# Patient Record
Sex: Male | Born: 1950 | Race: White | Hispanic: No | Smoking: Never smoker
Health system: Southern US, Community
[De-identification: ages and names within clinical notes are randomized; demographics above are authoritative.]

## PROBLEM LIST (undated history)

## (undated) DIAGNOSIS — E785 Hyperlipidemia, unspecified: Secondary | ICD-10-CM

## (undated) HISTORY — PX: APPENDECTOMY: SHX54

## (undated) HISTORY — PX: OTHER SURGICAL HISTORY: SHX169

## (undated) HISTORY — DX: Hyperlipidemia, unspecified: E78.5

---

## 2004-09-10 ENCOUNTER — Ambulatory Visit (HOSPITAL_COMMUNITY): Admission: RE | Admit: 2004-09-10 | Discharge: 2004-09-10 | Payer: Self-pay | Admitting: General Surgery

## 2007-08-11 ENCOUNTER — Ambulatory Visit (HOSPITAL_COMMUNITY): Admission: RE | Admit: 2007-08-11 | Discharge: 2007-08-11 | Payer: Self-pay | Admitting: Family Medicine

## 2009-08-24 ENCOUNTER — Emergency Department (HOSPITAL_COMMUNITY): Admission: EM | Admit: 2009-08-24 | Discharge: 2009-08-24 | Payer: Self-pay | Admitting: Emergency Medicine

## 2011-01-11 NOTE — H&P (Signed)
Patrick Burgess, Patrick Burgess               ACCOUNT NO.:  [id number]   MEDICAL RECORD NO.:  [id number]           PATIENT TYPE:   LOCATION:                                 FACILITY:   PHYSICIAN:  Dalia Heading, M.D.  DATE OF BIRTH:  June 02, 1951   DATE OF ADMISSION:  DATE OF DISCHARGE:  LH                                HISTORY & PHYSICAL   CHIEF COMPLAINT:  Need for screening colonoscopy.   HISTORY OF PRESENT ILLNESS:  The patient is a 60 year old white male who is  referred for endoscopic evaluation.  He needs colonoscopy for screening  purposes.  No abdominal pain, weight loss, nausea, vomiting, diarrhea,  constipation, melena, hematochezia have been noted.  He has never had a  colonoscopy.  There is no family history of colon carcinoma.   PAST MEDICAL HISTORY:  Unremarkable.   PAST SURGICAL HISTORY:  Appendectomy.   CURRENT MEDICATIONS:  None.   ALLERGIES:  No known drug allergies.   REVIEW OF SYSTEMS:  Noncontributory.   PHYSICAL EXAMINATION:  GENERAL APPEARANCE:  The patient is a well-developed,  well-nourished white male in no acute distress.  VITAL SIGNS:  He is afebrile and vital signs are stable.  LUNGS:  Clear to auscultation with good breath sounds bilaterally.  HEART:  Regular rate and rhythm without S3, S4, murmurs.  ABDOMEN:  Soft with an easily reducible left inguinal hernia.  No right  inguinal hernia is noted.  No hepatosplenomegaly or masses are noted.  RECTAL:  Deferred at the procedure.   IMPRESSION:  Need for screening colonoscopy, asymptomatic left inguinal  hernia.   PLAN:  The patient is scheduled for a colonoscopy on September 10, 2004.  The  risks and benefits of the procedure including bleeding and perforation were  fully explained to the patient gaining informed consent.     Mark   MAJ/MEDQ  D:  09/04/2004  T:  09/04/2004  Job:  [id number]   cc:   Mila Homer. Sudie Bailey, M.D.  [address] [place], [place] [address]  Fax: [phone]
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
## 2011-10-24 NOTE — H&P (Signed)
  NTS SOAP Note  Vital Signs:  Vitals as of: 10/24/2011: Systolic 134: Diastolic 75: Heart Rate 61: Temp 97F: Height 88ft 9.5in: Weight 160Lbs 0 Ounces: OFC 0in: Respiratory Rate 0: O2 Saturation 0: Pain Level 0: BMI 23  BMI : 23.29 kg/m2  Subjective: This 61 Years 2 Months old Male presents forcolonoscopy.  last had a TCS in 2006, polyp removed.  Denies any GI complaints.  No family h/o colon cancer.  Review of Symptoms:  Constitutional:unremarkable Head:unremarkable Eyes:unremarkable Nose/Mouth/Throat:unremarkable Cardiovascular:unremarkable Respiratory:unremarkable Gastrointestinal:unremarkable Genitourinary:unremarkable Musculoskeletal:unremarkable Skin:unremarkable Hematolgic/Lymphatic:unremarkable Allergic/Immunologic:unremarkable   Past Medical History:Reviewed   Past Medical History  Surgical History: appendectomy, TCS in 2006 Medical Problems: none Allergies: nkda Medications: none   Social History:Reviewed   Social History  Preferred Language: English ([place]) Ethnicity: Not Hispanic / Latino Age: 61 Years 2 Months Marital Status:  M Alcohol:  No Recreational drug(s):  No   Smoking Status: Never smoker reviewed on 10/24/2011  Family History:Reviewed   Family History  Is there a family history of:No family h/o colon cancer             Father:  Coronary Artery Disease    Objective Information: General:Well appearing, well nourished in no distress. Head:Atraumatic; no masses; no abnormalities Heart:RRR, no murmur or gallop.  Normal S1, S2.  No S3, S4.  Lungs:CTA bilaterally, no wheezes, rhonchi, rales.  Breathing unlabored. Abdomen:Soft, NT/ND, no HSM, no masses. deferred to procedure  Assessment:screening TCS  Diagnosis &amp; Procedure: DiagnosisCode: V76.51, ProcedureCode: 40981,    Plan:Scheduled for TCS on 10/29/11.   Patient Education:Alternative treatments  to surgery were discussed with patient (and family).Risks and benefits  of procedure were fully explained to the patient (and family) who gave informed consent. Patient/family questions were addressed.  Follow-up:Pending Surgery

## 2011-10-28 ENCOUNTER — Encounter (HOSPITAL_COMMUNITY): Payer: Self-pay | Admitting: Pharmacy Technician

## 2011-10-28 MED ORDER — SODIUM CHLORIDE 0.45 % IV SOLN
Freq: Once | INTRAVENOUS | Status: AC
  Administered 2011-10-29: 08:00:00 via INTRAVENOUS

## 2011-10-29 ENCOUNTER — Encounter (HOSPITAL_COMMUNITY)
Admission: RE | Disposition: A | Discharge status: Home / Self Care | Payer: Self-pay | Source: Ambulatory Visit | Attending: General Surgery

## 2011-10-29 ENCOUNTER — Encounter (HOSPITAL_COMMUNITY): Payer: Self-pay | Admitting: *Deleted

## 2011-10-29 ENCOUNTER — Ambulatory Visit (HOSPITAL_COMMUNITY)
Admission: RE | Admit: 2011-10-29 | Discharge: 2011-10-29 | Disposition: A | Discharge status: Home / Self Care | Payer: BC Managed Care – PPO | Source: Ambulatory Visit | Attending: General Surgery | Admitting: General Surgery

## 2011-10-29 DIAGNOSIS — Z860100 Personal history of colon polyps, unspecified: Secondary | ICD-10-CM | POA: Insufficient documentation

## 2011-10-29 HISTORY — PX: COLONOSCOPY: SHX5424

## 2011-10-29 SURGERY — COLONOSCOPY
Anesthesia: Moderate Sedation

## 2011-10-29 MED ORDER — MEPERIDINE HCL 50 MG/ML IJ SOLN
INTRAMUSCULAR | Status: AC
  Filled 2011-10-29: qty 1

## 2011-10-29 MED ORDER — MIDAZOLAM HCL 5 MG/5ML IJ SOLN
INTRAMUSCULAR | Status: AC
  Filled 2011-10-29: qty 5

## 2011-10-29 MED ORDER — STERILE WATER FOR IRRIGATION IR SOLN
Status: DC | PRN
  Administered 2011-10-29: 09:00:00

## 2011-10-29 MED ORDER — MIDAZOLAM HCL 5 MG/5ML IJ SOLN
INTRAMUSCULAR | Status: DC | PRN
  Administered 2011-10-29: 1 mg via INTRAVENOUS
  Administered 2011-10-29: 3 mg via INTRAVENOUS

## 2011-10-29 MED ORDER — MEPERIDINE HCL 25 MG/ML IJ SOLN
INTRAMUSCULAR | Status: DC | PRN
  Administered 2011-10-29: 50 mg via INTRAVENOUS
  Administered 2011-10-29: 25 mg via INTRAVENOUS

## 2011-10-29 NOTE — Discharge Instructions (Signed)
Colonoscopy Care After Read the instructions outlined below and refer to this sheet in the next few weeks. These discharge instructions provide you with general information on caring for yourself after you leave the hospital. Your doctor may also give you specific instructions. While your treatment has been planned according to the most current medical practices available, unavoidable complications occasionally occur. If you have any problems or questions after discharge, call your doctor. HOME CARE INSTRUCTIONS ACTIVITY:  You may resume your regular activity, but move at a slower pace for the next 24 hours.   Take frequent rest periods for the next 24 hours.   Walking will help get rid of the air and reduce the bloated feeling in your belly (abdomen).   No driving for 24 hours (because of the medicine (anesthesia) used during the test).   You may shower.   Do not sign any important legal documents or operate any machinery for 24 hours (because of the anesthesia used during the test).  NUTRITION:  Drink plenty of fluids.   You may resume your normal diet as instructed by your doctor.   Begin with a light meal and progress to your normal diet. Heavy or fried foods are harder to digest and may make you feel sick to your stomach (nauseated).   Avoid alcoholic beverages for 24 hours or as instructed.  MEDICATIONS:  You may resume your normal medications unless your doctor tells you otherwise.  WHAT TO EXPECT TODAY:  Some feelings of bloating in the abdomen.   Passage of more gas than usual.   Spotting of blood in your stool or on the toilet paper.  IF YOU HAD POLYPS REMOVED DURING THE COLONOSCOPY:  No aspirin products for 7 days or as instructed.   No alcohol for 7 days or as instructed.   Eat a soft diet for the next 24 hours.  FINDING OUT THE RESULTS OF YOUR TEST Not all test results are available during your visit. If your test results are not back during the visit, make an  appointment with your caregiver to find out the results. Do not assume everything is normal if you have not heard from your caregiver or the medical facility. It is important for you to follow up on all of your test results.  SEEK IMMEDIATE MEDICAL CARE IF:  You have more than a spotting of blood in your stool.   Your belly is swollen (abdominal distention).   You are nauseated or vomiting.   You have a fever.   You have abdominal pain or discomfort that is severe or gets worse throughout the day.  Document Released: 03/26/2004 Document Revised: 08/01/2011 Document Reviewed: 03/24/2008 Exit[employer] Patient Information 2012 [employer]. 

## 2011-10-29 NOTE — Interval H&P Note (Signed)
History and Physical Interval Note:  10/29/2011 9:09 AM  Patrick Burgess  has presented today for surgery, with the diagnosis of h/o polyps  The various methods of treatment have been discussed with the patient and family. After consideration of risks, benefits and other options for treatment, the patient has consented to  Procedure(s) (LRB): COLONOSCOPY (N/A) as a surgical intervention .  The patients' history has been reviewed, patient examined, no change in status, stable for surgery.  I have reviewed the patients' chart and labs.  Questions were answered to the patient's satisfaction.     Franky Macho A

## 2011-10-29 NOTE — Op Note (Signed)
[facility] [address] [place], [place]  [address]  COLONOSCOPY PROCEDURE REPORT  PATIENT:  Patrick Burgess, Patrick Burgess  MR#:  [id number] BIRTHDATE:  1950/09/20, 60 yrs. old  GENDER:  male ENDOSCOPIST:  Franky Macho, MD REF. BY:  Gareth Morgan, M.D. PROCEDURE DATE:  10/29/2011 PROCEDURE:  Diagnostic Colonoscopy ASA CLASS:  Class II INDICATIONS:  history of polyps MEDICATIONS:   Versed 4 mg IV, demerol 50 mg IV  DESCRIPTION OF PROCEDURE:   After the risks benefits and alternatives of the procedure were thoroughly explained, informed consent was obtained.  Digital rectal exam was performed and revealed no abnormalities.   The EC-[id number] ([id number]) endoscope was introduced through the anus and advanced to the cecum, which was identified by both the appendix and ileocecal valve, without limitations.  The quality of the prep was adequate..  The instrument was then slowly withdrawn as the colon was fully examined.  FINDINGS:  A normal colon was found. No polyps seen.   Retroflexed views in the rectum revealed no abnormalities.  The scope was then withdrawn from the cecum and the procedure completed. COMPLICATIONS:  None ENDOSCOPIC IMPRESSION: 1) Normal colon, no polyps seen RECOMMENDATIONS:  REPEAT EXAM:  In 10 year(s) for Colonoscopy.  ______________________________ Franky Macho, MD  CC:  Gareth Morgan MD  n. Rosalie DoctorFranky Macho at 10/29/2011 09:23 AM  Thurnell Garbe, [phone]

## 2011-11-01 ENCOUNTER — Encounter (HOSPITAL_COMMUNITY): Payer: Self-pay | Admitting: General Surgery

## 2022-04-16 ENCOUNTER — Encounter: Payer: Self-pay | Admitting: General Surgery

## 2022-04-16 ENCOUNTER — Ambulatory Visit (INDEPENDENT_AMBULATORY_CARE_PROVIDER_SITE_OTHER): Payer: Medicare Other | Admitting: General Surgery

## 2022-04-16 VITALS — BP 120/77 | HR 65 | Temp 97.8°F | Resp 16 | Ht 69.0 in | Wt 148.0 lb

## 2022-04-16 DIAGNOSIS — K4090 Unilateral inguinal hernia, without obstruction or gangrene, not specified as recurrent: Secondary | ICD-10-CM | POA: Diagnosis not present

## 2022-04-17 NOTE — H&P (Signed)
Patrick Burgess; [id number]; 08/16/1951   HPI Patient is a 71yo bm who was referred by Dr. Karie Kirks for evaluation and treatment of a left inguinal hernia.  Has been present for some time, but is increasing in size and causing him discomfort.  Made worse with straining.  Denies any episode of incarceration. History reviewed. No pertinent past medical history.  Past Surgical History:  Procedure Laterality Date   APPENDECTOMY     COLONOSCOPY  10/29/2011   Procedure: COLONOSCOPY;  Surgeon: Jamesetta So, MD;  Location: [facility];  Service: Gastroenterology;  Laterality: N/A;   colonscopy      History reviewed. No pertinent family history.  No current outpatient medications on file prior to visit.   No current facility-administered medications on file prior to visit.    No Known Allergies  Social History   Substance and Sexual Activity  Alcohol Use No    Social History   Tobacco Use  Smoking Status Never  Smokeless Tobacco Not on file    Review of Systems  Constitutional: Negative.   HENT: Negative.    Eyes: Negative.   Respiratory: Negative.    Cardiovascular: Negative.   Gastrointestinal: Negative.   Genitourinary:  Positive for frequency.  Musculoskeletal: Negative.   Skin: Negative.   Neurological: Negative.   Endo/Heme/Allergies: Negative.   Psychiatric/Behavioral: Negative.      Objective   Vitals:   04/16/22 1328  BP: 120/77  Pulse: 65  Resp: 16  Temp: 97.8 F (36.6 C)  SpO2: 97%    Physical Exam Vitals reviewed.  Constitutional:      Appearance: Normal appearance. He is normal weight. He is not ill-appearing.  HENT:     Head: Normocephalic and atraumatic.  Cardiovascular:     Rate and Rhythm: Normal rate and regular rhythm.     Heart sounds: Normal heart sounds. No murmur heard.    No friction rub. No gallop.  Pulmonary:     Effort: Pulmonary effort is normal. No respiratory distress.     Breath sounds: Normal breath sounds. No  stridor. No wheezing, rhonchi or rales.  Abdominal:     General: Abdomen is flat. Bowel sounds are normal. There is no distension.     Palpations: Abdomen is soft. There is no mass.     Tenderness: There is no abdominal tenderness. There is no guarding or rebound.     Hernia: A hernia is present.     Comments: Reducible left inguinal hernia.  Genitourinary:    Testes: Normal.  Skin:    General: Skin is warm and dry.  Neurological:     Mental Status: He is alert and oriented to person, place, and time.     Assessment  Left inguinal hernia Plan  Scheduled for left inguinal herniorrhaphy with mesh on 04/26/22.  The risks and benefits of the procedure including bleeding, infection, mesh use, and the possibility of recurrence were fully explained to the patient, who gives informed consent.

## 2022-04-17 NOTE — Progress Notes (Signed)
Patrick Burgess; [id number]; 30-Apr-1951   HPI Patient is a 71yo bm who was referred by Dr. Karie Burgess for evaluation and treatment of a left inguinal hernia.  Has been present for some time, but is increasing in size and causing him discomfort.  Made worse with straining.  Denies any episode of incarceration. History reviewed. No pertinent past medical history.  Past Surgical History:  Procedure Laterality Date   APPENDECTOMY     COLONOSCOPY  10/29/2011   Procedure: COLONOSCOPY;  Surgeon: Patrick So, MD;  Location: [facility];  Service: Gastroenterology;  Laterality: N/A;   colonscopy      History reviewed. No pertinent family history.  No current outpatient medications on file prior to visit.   No current facility-administered medications on file prior to visit.    No Known Allergies  Social History   Substance and Sexual Activity  Alcohol Use No    Social History   Tobacco Use  Smoking Status Never  Smokeless Tobacco Not on file    Review of Systems  Constitutional: Negative.   HENT: Negative.    Eyes: Negative.   Respiratory: Negative.    Cardiovascular: Negative.   Gastrointestinal: Negative.   Genitourinary:  Positive for frequency.  Musculoskeletal: Negative.   Skin: Negative.   Neurological: Negative.   Endo/Heme/Allergies: Negative.   Psychiatric/Behavioral: Negative.      Objective   Vitals:   04/16/22 1328  BP: 120/77  Pulse: 65  Resp: 16  Temp: 97.8 F (36.6 C)  SpO2: 97%    Physical Exam Vitals reviewed.  Constitutional:      Appearance: Normal appearance. He is normal weight. He is not ill-appearing.  HENT:     Head: Normocephalic and atraumatic.  Cardiovascular:     Rate and Rhythm: Normal rate and regular rhythm.     Heart sounds: Normal heart sounds. No murmur heard.    No friction rub. No gallop.  Pulmonary:     Effort: Pulmonary effort is normal. No respiratory distress.     Breath sounds: Normal breath sounds. No  stridor. No wheezing, rhonchi or rales.  Abdominal:     General: Abdomen is flat. Bowel sounds are normal. There is no distension.     Palpations: Abdomen is soft. There is no mass.     Tenderness: There is no abdominal tenderness. There is no guarding or rebound.     Hernia: A hernia is present.     Comments: Reducible left inguinal hernia.  Genitourinary:    Testes: Normal.  Skin:    General: Skin is warm and dry.  Neurological:     Mental Status: He is alert and oriented to person, place, and time.     Assessment  Left inguinal hernia Plan  Scheduled for left inguinal herniorrhaphy with mesh on 04/26/22.  The risks and benefits of the procedure including bleeding, infection, mesh use, and the possibility of recurrence were fully explained to the patient, who gives informed consent.

## 2022-04-22 ENCOUNTER — Encounter (HOSPITAL_COMMUNITY)
Admission: RE | Admit: 2022-04-22 | Discharge: 2022-04-22 | Disposition: A | Discharge status: Home / Self Care | Payer: Medicare Other | Source: Ambulatory Visit | Attending: General Surgery | Admitting: General Surgery

## 2022-04-22 DIAGNOSIS — Z01818 Encounter for other preprocedural examination: Secondary | ICD-10-CM | POA: Insufficient documentation

## 2022-04-22 NOTE — Patient Instructions (Signed)
Patrick Burgess  04/22/2022     '@[email or web address]'$ @   Your procedure is scheduled on 04/26/2022.  Report to [facility] at 6:00 A.M.  Call this number if you have problems the morning of surgery:  [phone]   Remember:  Do not eat or drink after midnight.     Take these medicines the morning of surgery with A SIP OF WATER : none    Do not wear jewelry, make-up or nail polish.  Do not wear lotions, powders, or perfumes, or deodorant.  Do not shave 48 hours prior to surgery.  Men may shave face and neck.  Do not bring valuables to the hospital.  [facility] is not responsible for any belongings or valuables.  Contacts, dentures or bridgework may not be worn into surgery.  Leave your suitcase in the car.  After surgery it may be brought to your room.  For patients admitted to the hospital, discharge time will be determined by your treatment team.  Patients discharged the day of surgery will not be allowed to drive home.   Name and phone number of your driver:   family Special instructions:  N/A  Please read over the following fact sheets that you were given. Care and Recovery After Surgery  Laparoscopic Inguinal Hernia Repair, Adult Laparoscopic inguinal hernia repair is a surgical procedure to repair a small, weak spot in the groin muscles that allows fat or intestines from inside the abdomen to bulge out (inguinal hernia). This procedure may be planned, or it may be an emergency procedure. During the procedure, tissue that has bulged out is moved back into place, and the opening in the groin muscles is repaired. This is done through three small incisions in the abdomen. A thin tube with a light and camera on the end (laparoscope) is used to help perform the procedure. Tell a health care provider about: Any allergies you have. All medicines you are taking, including vitamins, herbs, eye drops, creams, and over-the-counter medicines. Any problems you or family members have  had with anesthetic medicines. Any blood disorders you have. Any surgeries you have had. Any medical conditions you have. Whether you are pregnant or may be pregnant. What are the risks? Generally, this is a safe procedure. However, problems may occur, including: Infection. Bleeding. Allergic reactions to medicines. Damage to nearby structures or organs. Testicle damage or long-term pain and swelling of the scrotum, in males. Inability to completely empty the bladder (urinary retention). Blood clots. A collection of fluid that builds up under the skin (seroma). The hernia coming back (recurrence). What happens before the procedure? Staying hydrated Follow instructions from your health care provider about hydration, which may include: Up to 2 hours before the procedure - you may continue to drink clear liquids, such as water, clear fruit juice, black coffee, and plain tea.  Eating and drinking restrictions Follow instructions from your health care provider about eating and drinking, which may include: 8 hours before the procedure - stop eating heavy meals or foods, such as meat, fried foods, or fatty foods. 6 hours before the procedure - stop eating light meals or foods, such as toast or cereal. 6 hours before the procedure - stop drinking milk or drinks that contain milk. 2 hours before the procedure - stop drinking clear liquids. Medicines Ask your health care provider about: Changing or stopping your regular medicines. This is especially important if you are taking diabetes medicines or blood thinners. Taking medicines such as aspirin and  ibuprofen. These medicines can thin your blood. Do not take these medicines unless your health care provider tells you to take them. Taking over-the-counter medicines, vitamins, herbs, and supplements. General instructions Do not use any products that contain nicotine or tobacco for at least 4 weeks before the procedure, if possible. These  products include cigarettes, chewing tobacco, and vaping devices, such as e-cigarettes. If you need help quitting, ask your health care provider. Ask your health care provider: How your surgery site will be marked. What steps will be taken to help prevent infection. These steps may include: Removing hair at the surgery site. Washing skin with a germ-killing soap. Taking antibiotic medicine. Plan to have a responsible adult take you home from the hospital or clinic. Plan to have a responsible adult care for you for the time you are told after you leave the hospital or clinic. This is important. What happens during the procedure? An IV will be inserted into one of your veins. You will be given one or more of the following: A medicine to help you relax (sedative). A medicine to make you fall asleep (general anesthetic). Three small incisions will be made in your abdomen. Your abdomen will be inflated with carbon dioxide gas to make the surgical area easier to see. A laparoscope and surgical instruments will be inserted through the incisions. The laparoscope will send images of the inside of your abdomen to a monitor in the room. Tissue that is bulging through the hernia may be removed or moved back into place. The hernia opening will be closed with a sheet of surgical mesh. The surgical instruments and laparoscope will be removed. Your incisions will be closed with stitches (sutures) and adhesive strips. A bandage (dressing) will be placed over your incisions. The procedure may vary among health care providers and hospitals. What happens after the procedure? Your blood pressure, heart rate, breathing rate, and blood oxygen level will be monitored until you leave the hospital or clinic. You will be given pain medicine as needed. You may continue to receive medicines and fluids through an IV. The IV will be removed after you can drink fluids. You will be encouraged to get up and move around and  to take deep breaths frequently. If you were given a sedative during the procedure, it can affect you for several hours. Do not drive or operate machinery until your health care provider says that it is safe. Summary Laparoscopic inguinal hernia repair is a surgical procedure to repair a small, weak spot in the groin muscles that allows fat or intestines from inside the abdomen to bulge out (inguinal hernia). This procedure is done through three small incisions in the abdomen. A thin tube with a light and camera on the end (laparoscope) is used to help perform the procedure. After the procedure, you will be encouraged to get up and move around and to take deep breaths frequently. This information is not intended to replace advice given to you by your health care provider. Make sure you discuss any questions you have with your health care provider. Document Revised: 04/11/2020 Document Reviewed: 04/11/2020 Elsevier Patient Education  [place] Anesthesia, Adult General anesthesia is the use of medicine to make you fall asleep (unconscious) for a medical procedure. General anesthesia must be used for certain procedures. It is often recommended for surgery or procedures that: Last a long time. Require you to be still or in an unusual position. Are major and can cause blood  loss. Affect your breathing. The medicines used for general anesthesia are called general anesthetics. During general anesthesia, these medicines are given along with medicines that: Prevent pain. Control your blood pressure. Relax your muscles. Prevent nausea and vomiting after the procedure. Tell a health care provider about: Any allergies you have. All medicines you are taking, including vitamins, herbs, eye drops, creams, and over-the-counter medicines. Your history of any: Medical conditions you have, including: High blood pressure. Bleeding problems. Diabetes. Heart or lung conditions, such  as: Heart failure. Sleep apnea. Asthma. Chronic obstructive pulmonary disease (COPD). Current or recent illnesses, such as: Upper respiratory, chest, or ear infections. Cough or fever. Tobacco or drug use, including marijuana or alcohol use. Depression or anxiety. Surgeries and types of anesthetics you have had. Problems you or family members have had with anesthetic medicines. Whether you are pregnant or may be pregnant. Whether you have any chipped or loose teeth, dentures, caps, bridgework, or issues with your mouth, swallowing, or choking. What are the risks? Your health care provider will talk with you about risks. These may include: Allergic reaction to the medicines. Lung and heart problems. Inhaling food or liquid from the stomach into the lungs (aspiration). Nerve injury. Injury to the lips, mouth, teeth, or gums. Stroke. Waking up during your procedure and being unable to move. This is rare. These problems are more likely to develop if you are having a major surgery or if you have an advanced or serious medical condition. You can prevent some of these complications by answering all of your health care provider's questions thoroughly and by following all instructions before your procedure. General anesthesia can cause side effects, including: Nausea or vomiting. A sore throat or hoarseness from the breathing tube. Wheezing or coughing. Shaking chills or feeling cold. Body aches. Sleepiness. Confusion, agitation (delirium), or anxiety. What happens before the procedure? When to stop eating and drinking Follow instructions from your health care provider about what you may eat and drink before your procedure. If you do not follow your health care provider's instructions, your procedure may be delayed or canceled. Medicines Ask your health care provider about: Changing or stopping your regular medicines. These include any diabetes medicines or blood thinners you  take. Taking medicines such as aspirin and ibuprofen. These medicines can thin your blood. Do not take them unless your health care provider tells you to. Taking over-the-counter medicines, vitamins, herbs, and supplements. General instructions Do not use any products that contain nicotine or tobacco for at least 4 weeks before the procedure. These products include cigarettes, chewing tobacco, and vaping devices, such as e-cigarettes. If you need help quitting, ask your health care provider. If you brush your teeth on the morning of the procedure, make sure to spit out all of the water and toothpaste. If told by your health care provider, bring your sleep apnea device with you to surgery (if applicable). If you will be going home right after the procedure, plan to have a responsible adult: Take you home from the hospital or clinic. You will not be allowed to drive. Care for you for the time you are told. What happens during the procedure?  An IV will be inserted into one of your veins. You will be given one or more of the following through a face mask or IV: A sedative. This helps you relax. Anesthesia. This will: Numb certain areas of your body. Make you fall asleep for surgery. After you are unconscious, a breathing tube may be inserted  down your throat to help you breathe. This will be removed before you wake up. An anesthesia provider, such as an anesthesiologist, will stay with you throughout your procedure. The anesthesia provider will: Keep you comfortable and safe by continuing to give you medicines and adjusting the amount of medicine that you get. Monitor your blood pressure, heart rate, and oxygen levels to make sure that the anesthetics do not cause any problems. The procedure may vary among health care providers and hospitals. What happens after the procedure? Your blood pressure, temperature, heart rate, breathing rate, and blood oxygen level will be monitored until you leave  the hospital or clinic. You will wake up in a recovery area. You may wake up slowly. You may be given medicine to help you with pain, nausea, or any other side effects from the anesthesia. Summary General anesthesia is the use of medicine to make you fall asleep (unconscious) for a medical procedure. Follow your health care provider's instructions about when to stop eating, drinking, or taking certain medicines before your procedure. Plan to have a responsible adult take you home from the hospital or clinic. This information is not intended to replace advice given to you by your health care provider. Make sure you discuss any questions you have with your health care provider. Document Revised: 11/08/2021 Document Reviewed: 11/08/2021 Elsevier Patient Education  [place].

## 2022-04-26 ENCOUNTER — Ambulatory Visit (HOSPITAL_COMMUNITY): Payer: Medicare Other | Admitting: Certified Registered Nurse Anesthetist

## 2022-04-26 ENCOUNTER — Encounter (HOSPITAL_COMMUNITY)
Admission: RE | Disposition: A | Discharge status: Home / Self Care | Payer: Self-pay | Source: Ambulatory Visit | Attending: General Surgery

## 2022-04-26 ENCOUNTER — Other Ambulatory Visit: Payer: Self-pay

## 2022-04-26 ENCOUNTER — Encounter (HOSPITAL_COMMUNITY): Payer: Self-pay | Admitting: General Surgery

## 2022-04-26 ENCOUNTER — Ambulatory Visit (HOSPITAL_BASED_OUTPATIENT_CLINIC_OR_DEPARTMENT_OTHER): Payer: Medicare Other | Admitting: Certified Registered Nurse Anesthetist

## 2022-04-26 ENCOUNTER — Ambulatory Visit (HOSPITAL_COMMUNITY)
Admission: RE | Admit: 2022-04-26 | Discharge: 2022-04-26 | Disposition: A | Discharge status: Home / Self Care | Payer: Medicare Other | Source: Ambulatory Visit | Attending: General Surgery | Admitting: General Surgery

## 2022-04-26 DIAGNOSIS — D176 Benign lipomatous neoplasm of spermatic cord: Secondary | ICD-10-CM | POA: Insufficient documentation

## 2022-04-26 DIAGNOSIS — K4090 Unilateral inguinal hernia, without obstruction or gangrene, not specified as recurrent: Secondary | ICD-10-CM

## 2022-04-26 HISTORY — PX: INGUINAL HERNIA REPAIR: SHX194

## 2022-04-26 SURGERY — REPAIR, HERNIA, INGUINAL, ADULT
Anesthesia: General | Site: Inguinal | Laterality: Left

## 2022-04-26 MED ORDER — CHLORHEXIDINE GLUCONATE CLOTH 2 % EX PADS: 6.0000 | MEDICATED_PAD | Freq: Once | CUTANEOUS | Status: DC

## 2022-04-26 MED ORDER — FENTANYL CITRATE (PF) 100 MCG/2ML IJ SOLN
INTRAMUSCULAR | Status: AC
  Filled 2022-04-26: qty 2

## 2022-04-26 MED ORDER — CHLORHEXIDINE GLUCONATE 0.12 % MT SOLN
15.0000 mL | Freq: Once | OROMUCOSAL | Status: AC
  Administered 2022-04-26: 15 mL via OROMUCOSAL

## 2022-04-26 MED ORDER — SODIUM CHLORIDE 0.9 % IR SOLN
Status: DC | PRN
  Administered 2022-04-26: 1000 mL

## 2022-04-26 MED ORDER — ORAL CARE MOUTH RINSE: 15.0000 mL | Freq: Once | OROMUCOSAL | Status: AC

## 2022-04-26 MED ORDER — KETOROLAC TROMETHAMINE 30 MG/ML IJ SOLN
15.0000 mg | Freq: Once | INTRAMUSCULAR | Status: AC
  Administered 2022-04-26: 15 mg via INTRAVENOUS
  Filled 2022-04-26: qty 1

## 2022-04-26 MED ORDER — PROPOFOL 10 MG/ML IV BOLUS
INTRAVENOUS | Status: AC
  Filled 2022-04-26: qty 20

## 2022-04-26 MED ORDER — LIDOCAINE HCL (PF) 2 % IJ SOLN
INTRAMUSCULAR | Status: AC
  Filled 2022-04-26: qty 5

## 2022-04-26 MED ORDER — FENTANYL CITRATE (PF) 250 MCG/5ML IJ SOLN
INTRAMUSCULAR | Status: DC | PRN
Start: 2022-04-26 — End: 2022-04-26
  Administered 2022-04-26: 50 ug via INTRAVENOUS
  Administered 2022-04-26 (×2): 25 ug via INTRAVENOUS

## 2022-04-26 MED ORDER — EPHEDRINE SULFATE (PRESSORS) 50 MG/ML IJ SOLN
INTRAMUSCULAR | Status: DC | PRN
  Administered 2022-04-26 (×2): 5 mg via INTRAVENOUS

## 2022-04-26 MED ORDER — ONDANSETRON HCL 4 MG/2ML IJ SOLN
INTRAMUSCULAR | Status: DC | PRN
  Administered 2022-04-26: 4 mg via INTRAVENOUS

## 2022-04-26 MED ORDER — PROPOFOL 10 MG/ML IV BOLUS
INTRAVENOUS | Status: DC | PRN
  Administered 2022-04-26: 60 mg via INTRAVENOUS
  Administered 2022-04-26: 50 mg via INTRAVENOUS
  Administered 2022-04-26: 140 mg via INTRAVENOUS

## 2022-04-26 MED ORDER — HYDROCODONE-ACETAMINOPHEN 5-325 MG PO TABS: 1.0000 | ORAL_TABLET | Freq: Four times a day (QID) | ORAL | 0 refills | Status: DC | PRN

## 2022-04-26 MED ORDER — CEFAZOLIN SODIUM-DEXTROSE 2-4 GM/100ML-% IV SOLN
2.0000 g | INTRAVENOUS | Status: AC
  Administered 2022-04-26: 2 g via INTRAVENOUS
  Filled 2022-04-26: qty 100

## 2022-04-26 MED ORDER — ONDANSETRON HCL 4 MG/2ML IJ SOLN
INTRAMUSCULAR | Status: AC
  Filled 2022-04-26: qty 2

## 2022-04-26 MED ORDER — DEXAMETHASONE SODIUM PHOSPHATE 10 MG/ML IJ SOLN
INTRAMUSCULAR | Status: DC | PRN
  Administered 2022-04-26: 5 mg via INTRAVENOUS

## 2022-04-26 MED ORDER — BUPIVACAINE HCL (300 MG DOSE) 3 X 100 MG IL IMPL
DRUG_IMPLANT | Status: AC
  Filled 2022-04-26: qty 100

## 2022-04-26 MED ORDER — EPHEDRINE 5 MG/ML INJ
INTRAVENOUS | Status: AC
  Filled 2022-04-26: qty 5

## 2022-04-26 MED ORDER — LACTATED RINGERS IV SOLN
INTRAVENOUS | Status: DC
  Administered 2022-04-26: 1000 mL via INTRAVENOUS

## 2022-04-26 MED ORDER — DEXAMETHASONE SODIUM PHOSPHATE 10 MG/ML IJ SOLN
INTRAMUSCULAR | Status: AC
  Filled 2022-04-26: qty 1

## 2022-04-26 MED ORDER — LIDOCAINE HCL (CARDIAC) PF 100 MG/5ML IV SOSY
PREFILLED_SYRINGE | INTRAVENOUS | Status: DC | PRN
  Administered 2022-04-26: 50 mg via INTRAVENOUS

## 2022-04-26 MED ORDER — BUPIVACAINE HCL (300 MG DOSE) 3 X 100 MG IL IMPL
DRUG_IMPLANT | Status: DC | PRN
  Administered 2022-04-26: 200 mg

## 2022-04-26 SURGICAL SUPPLY — 30 items
ADH SKN CLS APL DERMABOND .7 (GAUZE/BANDAGES/DRESSINGS) ×1
CLOTH BEACON ORANGE TIMEOUT ST (SAFETY) ×2 IMPLANT
COVER LIGHT HANDLE STERIS (MISCELLANEOUS) ×4 IMPLANT
DERMABOND ADVANCED (GAUZE/BANDAGES/DRESSINGS) ×1
DERMABOND ADVANCED .7 DNX12 (GAUZE/BANDAGES/DRESSINGS) ×2 IMPLANT
DRAIN PENROSE 0.5X18 (DRAIN) ×2 IMPLANT
ELECT REM PT RETURN 9FT ADLT (ELECTROSURGICAL) ×1
ELECTRODE REM PT RTRN 9FT ADLT (ELECTROSURGICAL) ×2 IMPLANT
GAUZE SPONGE 4X4 12PLY STRL (GAUZE/BANDAGES/DRESSINGS) ×2 IMPLANT
GLOVE BIO SURGEON STRL SZ7 (GLOVE) IMPLANT
GLOVE BIOGEL PI IND STRL 7.0 (GLOVE) ×4 IMPLANT
GLOVE SURG SS PI 7.5 STRL IVOR (GLOVE) ×2 IMPLANT
GOWN STRL REUS W/TWL LRG LVL3 (GOWN DISPOSABLE) ×6 IMPLANT
INST SET MINOR GENERAL (KITS) ×2 IMPLANT
KIT TURNOVER KIT A (KITS) ×2 IMPLANT
MANIFOLD NEPTUNE II (INSTRUMENTS) ×2 IMPLANT
MESH HERNIA 1.6X1.9 PLUG LRG (Mesh General) IMPLANT
MESH HERNIA PLUG LRG (Mesh General) ×1 IMPLANT
NS IRRIG 1000ML POUR BTL (IV SOLUTION) ×2 IMPLANT
PACK MINOR (CUSTOM PROCEDURE TRAY) ×2 IMPLANT
PAD ARMBOARD 7.5X6 YLW CONV (MISCELLANEOUS) ×2 IMPLANT
SET BASIN LINEN APH (SET/KITS/TRAYS/PACK) ×2 IMPLANT
SOL PREP PROV IODINE SCRUB 4OZ (MISCELLANEOUS) ×2 IMPLANT
SUT MNCRL AB 4-0 PS2 18 (SUTURE) ×2 IMPLANT
SUT NOVA NAB GS-22 2 2-0 T-19 (SUTURE) ×4 IMPLANT
SUT VIC AB 2-0 CT1 27 (SUTURE) ×1
SUT VIC AB 2-0 CT1 TAPERPNT 27 (SUTURE) ×2 IMPLANT
SUT VIC AB 3-0 SH 27 (SUTURE) ×1
SUT VIC AB 3-0 SH 27X BRD (SUTURE) ×2 IMPLANT
SUT VICRYL AB 3 0 TIES (SUTURE) IMPLANT

## 2022-04-26 NOTE — Anesthesia Postprocedure Evaluation (Signed)
Anesthesia Post Note  Patient: Patrick Burgess  Procedure(s) Performed: HERNIA REPAIR INGUINAL ADULT WITH MESH (Left: Inguinal)  Patient location during evaluation: Phase II Anesthesia Type: General Level of consciousness: awake Pain management: pain level controlled Vital Signs Assessment: post-procedure vital signs reviewed and stable Respiratory status: spontaneous breathing and respiratory function stable Cardiovascular status: blood pressure returned to baseline and stable Postop Assessment: no headache and no apparent nausea or vomiting Anesthetic complications: no Comments: Late entry   No notable events documented.   Last Vitals:  Vitals:   04/26/22 0825 04/26/22 0845  BP: 129/83 126/83  Pulse: (!) 57 62  Resp: 10 14  Temp: 36.6 C   SpO2: 100% 100%    Last Pain:  Vitals:   04/26/22 0845  TempSrc:   PainSc: [place]

## 2022-04-26 NOTE — Interval H&P Note (Signed)
History and Physical Interval Note:  04/26/2022 7:04 AM  Patrick Burgess  has presented today for surgery, with the diagnosis of Left inguinal hernia.  The various methods of treatment have been discussed with the patient and family. After consideration of risks, benefits and other options for treatment, the patient has consented to  Procedure(s): HERNIA REPAIR INGUINAL ADULT (Left) as a surgical intervention.  The patient's history has been reviewed, patient examined, no change in status, stable for surgery.  I have reviewed the patient's chart and labs.  Questions were answered to the patient's satisfaction.     Aviva Signs

## 2022-04-26 NOTE — Op Note (Signed)
Patient:  Patrick Burgess  DOB:  Jan 01, 1951  MRN:  [id number]   Preop Diagnosis: Left inguinal hernia  Postop Diagnosis: Same  Procedure: Left inguinal herniorrhaphy with mesh  Surgeon: Aviva Signs, MD  Anes: General  Indications: Patient is a 71 year old white male who presents with a symptomatic left inguinal hernia.  The risks and benefits of the procedure including bleeding, infection, mesh use, and the possibility of recurrence of the hernia were fully explained to the patient, who gave informed consent.  Procedure note: The patient was placed in the supine position.  After general anesthesia was administered, the left groin region was prepped and draped using usual sterile technique with Betadine.  Surgical site confirmation was performed.  An incision was made in the left groin region down to the external oblique aponeuroses.  The aponeurosis was incised to the external ring.  A Penrose drain was placed around the spermatic cord.  The vas deferens was noted within the spermatic cord.  The patient had a lipoma of the cord and a high ligation was performed using a 2-0 Vicryl tie.  The excess lipoma was excised.  Care was taken to avoid the ilioinguinal nerve.  The patient had a large direct hernia.  This was incised along its base and inverted.  A Bard PerFix plug was then inserted and secured to the transversalis fascia circumferentially using 2-0 Novafil interrupted sutures.  An onlay patch was placed along the floor of the inguinal canal and secured superiorly to the conjoined tendon and inferiorly to the shelving edge of Poupart's ligament using 2-0 Novafil interrupted sutures.  The internal ring was recreated using a 2-0 Novafil interrupted suture.  Robynn Pane was placed over the mesh in the subcutaneous tissue.  The external oblique aponeurosis was reapproximated using a 2-0 Vicryl running suture.  The subcutaneous layer was reapproximated using 3-0 Vicryl interrupted sutures.  The skin  was closed using a 4-0 Monocryl subcuticular suture.  Dermabond was applied.  All tape and needle counts were correct at the end of the procedure.  The patient was awakened and transferred to PACU in stable condition.  Complications: None  EBL: Minimal  Specimen: None

## 2022-04-26 NOTE — Anesthesia Procedure Notes (Signed)
Procedure Name: LMA Insertion Date/Time: 04/26/2022 7:34 AM  Performed by: Karna Dupes, CRNAPre-anesthesia Checklist: Emergency Drugs available, Patient identified, Suction available and Patient being monitored Patient Re-evaluated:Patient Re-evaluated prior to induction Oxygen Delivery Method: Circle system utilized Preoxygenation: Pre-oxygenation with 100% oxygen Induction Type: IV induction LMA: LMA inserted LMA Size: 3.0 Number of attempts: 2 (LMA 4 would not seat properly) Placement Confirmation: positive ETCO2 and breath sounds checked- equal and bilateral Tube secured with: Tape Dental Injury: Teeth and Oropharynx as per pre-operative assessment

## 2022-04-26 NOTE — Transfer of Care (Signed)
Immediate Anesthesia Transfer of Care Note  Patient: Patrick Burgess  Procedure(s) Performed: HERNIA REPAIR INGUINAL ADULT WITH MESH (Left: Inguinal)  Patient Location: PACU  Anesthesia Type:General  Level of Consciousness: drowsy  Airway & Oxygen Therapy: Patient Spontanous Breathing and Patient connected to face mask oxygen  Post-op Assessment: Report given to RN and Post -op Vital signs reviewed and stable  Post vital signs: Reviewed and stable  Last Vitals:  Vitals Value Taken Time  BP 129/83 04/26/22 0825  Temp 36.6 C 04/26/22 0825  Pulse 57   Resp 10 04/26/22 0825  SpO2 100 % 04/26/22 0825    Last Pain:  Vitals:   04/26/22 0641  TempSrc: Oral  PainSc: 0-No pain      Patients Stated Pain Goal: 6 (22/58/34 6219)  Complications: No notable events documented.

## 2022-04-26 NOTE — Anesthesia Preprocedure Evaluation (Signed)
Anesthesia Evaluation  Patient identified by MRN, date of birth, ID band Patient awake    Reviewed: Allergy & Precautions, H&P , NPO status , Patient's Chart, lab work & pertinent test results, reviewed documented beta blocker date and time   Airway Mallampati: II  TM Distance: >3 FB Neck ROM: full    Dental no notable dental hx.    Pulmonary neg pulmonary ROS,    Pulmonary exam normal breath sounds clear to auscultation       Cardiovascular Exercise Tolerance: Good negative cardio ROS   Rhythm:regular Rate:Normal     Neuro/Psych negative neurological ROS  negative psych ROS   GI/Hepatic negative GI ROS, Neg liver ROS,   Endo/Other  negative endocrine ROS  Renal/GU negative Renal ROS  negative genitourinary   Musculoskeletal   Abdominal   Peds  Hematology negative hematology ROS (+)   Anesthesia Other Findings   Reproductive/Obstetrics negative OB ROS                             Anesthesia Physical Anesthesia Plan  ASA: 2  Anesthesia Plan: General and General LMA   Post-op Pain Management:    Induction:   PONV Risk Score and Plan: Ondansetron  Airway Management Planned:   Additional Equipment:   Intra-op Plan:   Post-operative Plan:   Informed Consent: I have reviewed the patients History and Physical, chart, labs and discussed the procedure including the risks, benefits and alternatives for the proposed anesthesia with the patient or authorized representative who has indicated his/her understanding and acceptance.     Dental Advisory Given  Plan Discussed with: CRNA  Anesthesia Plan Comments:         Anesthesia Quick Evaluation

## 2022-05-03 ENCOUNTER — Encounter: Payer: Self-pay | Admitting: General Surgery

## 2022-05-03 ENCOUNTER — Ambulatory Visit (INDEPENDENT_AMBULATORY_CARE_PROVIDER_SITE_OTHER): Payer: Medicare Other | Admitting: General Surgery

## 2022-05-03 DIAGNOSIS — Z09 Encounter for follow-up examination after completed treatment for conditions other than malignant neoplasm: Secondary | ICD-10-CM

## 2022-05-03 NOTE — Progress Notes (Signed)
Attempted virtual telephone postoperative visit with patient.  Left message on the phone for the patient to call me should he have any issues.

## 2023-04-04 ENCOUNTER — Encounter: Payer: Self-pay | Admitting: Internal Medicine

## 2023-04-04 ENCOUNTER — Ambulatory Visit (INDEPENDENT_AMBULATORY_CARE_PROVIDER_SITE_OTHER): Payer: Medicare Other | Admitting: Internal Medicine

## 2023-04-04 VITALS — BP 128/81 | HR 60 | Ht 71.0 in | Wt 158.6 lb

## 2023-04-04 DIAGNOSIS — E785 Hyperlipidemia, unspecified: Secondary | ICD-10-CM | POA: Diagnosis not present

## 2023-04-04 DIAGNOSIS — Z1211 Encounter for screening for malignant neoplasm of colon: Secondary | ICD-10-CM

## 2023-04-04 NOTE — Patient Instructions (Signed)
It was a pleasure to see you today.  Thank you for giving [place] the opportunity to be involved in your care.  Below is a brief recap of your visit and next steps.  We will plan to see you again in 1 year.  Summary You have established care today No medication changes have been made I recommend calling Dr. Lovell Sheehan' office to discuss repeat colonoscopy Follow up in 1 year for annual exam

## 2023-04-04 NOTE — Assessment & Plan Note (Signed)
He is due for repeat colonoscopy.  Last completed in September 2013.  He plans to contact his surgeon (Dr. Lovell Sheehan) to discuss repeat colonoscopy.  Previously deferred in the setting of left inguinal herniorrhapy with mesh in September 2023.

## 2023-04-04 NOTE — Assessment & Plan Note (Signed)
Currently prescribed atorvastatin 20 mg daily.  States that his lipid panel was recently updated by his previous PCP and was well-controlled. -No medication change today.  Recent labs have been requested.

## 2023-04-04 NOTE — Progress Notes (Signed)
New Patient Office Visit  Subjective    Patient ID: Patrick Burgess, male    DOB: [birth year]  Age: 72 y.o. MRN: [id number]  CC:  Chief Complaint  Patient presents with   Establish Care    HPI Patrick Burgess presents to establish care.  He is a 72 year old man who endorses a past medical history significant for hyperlipidemia.  Previously followed by Dr. Sudie Burgess.  Patrick Burgess reports feeling well today.  He is asymptomatic currently and has no acute concerns to discuss aside from desiring to establish care.  He is a retired Personnel officer and denies a history of tobacco, alcohol, and illicit drug use.  His family medical history is significant for coronary artery disease.  Chronic medical conditions and outstanding preventative care items discussed today are individually dressed A/P below.   Outpatient Encounter Medications as of 04/04/2023  Medication Sig   atorvastatin (LIPITOR) 20 MG tablet Take 20 mg by mouth at bedtime.   [DISCONTINUED] HYDROcodone-acetaminophen (NORCO) 5-325 MG tablet Take 1 tablet by mouth every 6 (six) hours as needed for moderate pain.   No facility-administered encounter medications on file as of 04/04/2023.    No past medical history on file.  Past Surgical History:  Procedure Laterality Date   APPENDECTOMY     COLONOSCOPY  10/29/2011   Procedure: COLONOSCOPY;  Surgeon: Dalia Heading, MD;  [facility];  Service: Gastroenterology;  Laterality: N/A;   colonscopy     INGUINAL HERNIA REPAIR Left 04/26/2022   Procedure: HERNIA REPAIR INGUINAL ADULT WITH MESH;  Surgeon: Franky Macho, MD;  Location: [facility];  Service: General;  Laterality: Left;    No family history on file.  Social History   Socioeconomic History   Marital status: Divorced    Spouse name: Not on file   Number of children: Not on file   Years of education: Not on file   Highest education level: Not on file  Occupational History   Not on file  Tobacco Use   Smoking status:  Never   Smokeless tobacco: Not on file  Substance and Sexual Activity   Alcohol use: No   Drug use: No   Sexual activity: Not on file  Other Topics Concern   Not on file  Social History Narrative   Not on file   Social Determinants of Health   Financial Resource Strain: Not on file  Food Insecurity: Not on file  Transportation Needs: Not on file  Physical Activity: Not on file  Stress: Not on file  Social Connections: Unknown (01/08/2022)   Received from [facility]   Social Network    Social Network: Not on file  Intimate Partner Violence: Unknown (11/30/2021)   Received from [facility]   HITS    Physically Hurt: Not on file    Insult or Talk Down To: Not on file    Threaten Physical Harm: Not on file    Scream or Curse: Not on file    Review of Systems  Constitutional:  Negative for chills and fever.  HENT:  Negative for sore throat.   Respiratory:  Negative for cough and shortness of breath.   Cardiovascular:  Negative for chest pain, palpitations and leg swelling.  Gastrointestinal:  Negative for abdominal pain, blood in stool, constipation, diarrhea, nausea and vomiting.  Genitourinary:  Negative for dysuria and hematuria.  Musculoskeletal:  Negative for myalgias.  Skin:  Negative for itching and rash.  Neurological:  Negative  for dizziness and headaches.  Psychiatric/Behavioral:  Negative for depression and suicidal ideas.     Objective    BP 128/81   Pulse 60   Ht 5\' 11"  (1.803 m)   Wt 158 lb 9.6 oz (71.9 kg)   SpO2 98%   BMI 22.12 kg/m   Physical Exam Vitals reviewed.  Constitutional:      General: He is not in acute distress.    Appearance: Normal appearance. He is not ill-appearing.  HENT:     Head: Normocephalic and atraumatic.     Right Ear: External ear normal.     Left Ear: External ear normal.     Nose: Nose normal. No congestion or rhinorrhea.     Mouth/Throat:     Mouth: Mucous membranes are moist.      Pharynx: Oropharynx is clear.  Eyes:     General: No scleral icterus.    Extraocular Movements: Extraocular movements intact.     Conjunctiva/sclera: Conjunctivae normal.     Pupils: Pupils are equal, round, and reactive to light.  Cardiovascular:     Rate and Rhythm: Normal rate and regular rhythm.     Pulses: Normal pulses.     Heart sounds: Normal heart sounds. No murmur heard. Pulmonary:     Effort: Pulmonary effort is normal.     Breath sounds: Normal breath sounds. No wheezing, rhonchi or rales.  Abdominal:     General: Abdomen is flat. Bowel sounds are normal. There is no distension.     Palpations: Abdomen is soft.     Tenderness: There is no abdominal tenderness.  Musculoskeletal:        General: No swelling or deformity. Normal range of motion.     Cervical back: Normal range of motion.  Skin:    General: Skin is warm and dry.     Capillary Refill: Capillary refill takes less than 2 seconds.  Neurological:     General: No focal deficit present.     Mental Status: He is alert and oriented to person, place, and time.     Motor: No weakness.  Psychiatric:        Mood and Affect: Mood normal.        Behavior: Behavior normal.        Thought Content: Thought content normal.    Assessment & Plan:   Problem List Items Addressed This Visit       Hyperlipidemia - Primary    Currently prescribed atorvastatin 20 mg daily.  States that his lipid panel was recently updated by his previous PCP and was well-controlled. -No medication change today.  Recent labs have been requested.      Colon cancer screening    He is due for repeat colonoscopy.  Last completed in September 2013.  He plans to contact his surgeon (Patrick Burgess) to discuss repeat colonoscopy.  Previously deferred in the setting of left inguinal herniorrhapy with mesh in September 2023.       Return in about 1 year (around 04/03/2024) for CPE.   Billie Lade, MD

## 2023-04-25 ENCOUNTER — Other Ambulatory Visit: Payer: Self-pay | Admitting: Internal Medicine

## 2023-05-01 ENCOUNTER — Telehealth: Payer: Self-pay | Admitting: Internal Medicine

## 2023-05-01 NOTE — Telephone Encounter (Signed)
Call patient on cell phone [phone]

## 2023-05-01 NOTE — Telephone Encounter (Signed)
Patient returning a call to nurse

## 2023-05-01 NOTE — Telephone Encounter (Signed)
Krisitin spoke to patient

## 2023-05-03 LAB — CMP14+EGFR
ALT: 26 IU/L (ref 0–44)
AST: 25 IU/L (ref 0–40)
Albumin: 4.1 g/dL (ref 3.8–4.8)
Alkaline Phosphatase: 123 IU/L — ABNORMAL HIGH (ref 44–121)
BUN/Creatinine Ratio: 10 (ref 10–24)
BUN: 8 mg/dL (ref 8–27)
Bilirubin Total: 1.9 mg/dL — ABNORMAL HIGH (ref 0.0–1.2)
CO2: 25 mmol/L (ref 20–29)
Calcium: 9.4 mg/dL (ref 8.6–10.2)
Chloride: 103 mmol/L (ref 96–106)
Creatinine, Ser: 0.84 mg/dL (ref 0.76–1.27)
Globulin, Total: 2.1 g/dL (ref 1.5–4.5)
Glucose: 93 mg/dL (ref 70–99)
Potassium: 4.3 mmol/L (ref 3.5–5.2)
Sodium: 141 mmol/L (ref 134–144)
Total Protein: 6.2 g/dL (ref 6.0–8.5)
eGFR: 93 mL/min/{1.73_m2} (ref >59)

## 2023-05-03 LAB — BILIRUBIN, DIRECT: Bilirubin, Direct: 0.35 mg/dL (ref 0.00–0.40)

## 2023-05-06 ENCOUNTER — Telehealth: Payer: Self-pay | Admitting: Internal Medicine

## 2023-05-06 NOTE — Telephone Encounter (Signed)
Patient advised.

## 2023-05-06 NOTE — Telephone Encounter (Signed)
Patient calling in about lab results from last week. Call cell phone # [phone].

## 2023-05-30 ENCOUNTER — Other Ambulatory Visit: Payer: Self-pay | Admitting: Internal Medicine

## 2023-05-30 DIAGNOSIS — Z1212 Encounter for screening for malignant neoplasm of rectum: Secondary | ICD-10-CM

## 2023-05-30 DIAGNOSIS — Z1211 Encounter for screening for malignant neoplasm of colon: Secondary | ICD-10-CM

## 2023-06-05 ENCOUNTER — Telehealth: Payer: Self-pay | Admitting: Internal Medicine

## 2023-06-05 NOTE — Telephone Encounter (Signed)
Spoke with patient, advised that Dr. Durwin Nora ordered it and to go ahead and do it.

## 2023-06-05 NOTE — Telephone Encounter (Signed)
Patient calling said he received a cologaurd in the mail- not sure who ordered it but wants to know if he should go ahead and do it or not. Please advise Thank you

## 2023-06-13 LAB — COLOGUARD: COLOGUARD: NEGATIVE

## 2023-06-24 ENCOUNTER — Ambulatory Visit: Payer: Medicare Other

## 2023-06-24 ENCOUNTER — Telehealth: Payer: Self-pay | Admitting: Internal Medicine

## 2023-06-24 VITALS — Ht 71.0 in | Wt 158.0 lb

## 2023-06-24 DIAGNOSIS — Z0100 Encounter for examination of eyes and vision without abnormal findings: Secondary | ICD-10-CM

## 2023-06-24 DIAGNOSIS — Z0000 Encounter for general adult medical examination without abnormal findings: Secondary | ICD-10-CM | POA: Diagnosis not present

## 2023-06-24 NOTE — Telephone Encounter (Signed)
Patient does not want to go to [facility], he rather go to [facility], Dr Charise Killian office in [place] He is okay to wait til May 2025.

## 2023-06-24 NOTE — Progress Notes (Signed)
Subjective:   Patrick Burgess is a 72 y.o. male who presents for an Initial Medicare Annual Wellness Visit.  Visit Complete: Virtual I connected with  Patrick Burgess on 06/24/23 by a audio enabled telemedicine application and verified that I am speaking with the correct person using two identifiers.  Patient Location: Home  Provider Location: Home Office  I discussed the limitations of evaluation and management by telemedicine. The patient expressed understanding and agreed to proceed.  Vital Signs: Because this visit was a virtual/telehealth visit, some criteria may be missing or patient reported. Any vitals not documented were not able to be obtained and vitals that have been documented are patient reported.  Patient Medicare AWV questionnaire was completed by the patient on 06/24/2023; I have confirmed that all information answered by patient is correct and no changes since this date.  Cardiac Risk Factors include: advanced age (>54men, >52 women);male gender;dyslipidemia     Objective:    Today's Vitals   06/24/23 0832  Weight: 158 lb (71.7 kg)  Height: 5\' 11"  (1.803 m)   Body mass index is 22.04 kg/m.     06/24/2023    8:36 AM 04/26/2022    6:24 AM 04/22/2022    2:03 PM 10/29/2011    8:08 AM  Advanced Directives  Does Patient Have a Medical Advance Directive? Yes Yes No Patient does not have advance directive  Type of Advance Directive Healthcare Power of [place];Living will Living will    Does patient want to make changes to medical advance directive?  No - Patient declined    Copy of Healthcare Power of Attorney in Chart? No - copy requested     Would patient like information on creating a medical advance directive?   No - Patient declined   Pre-existing out of facility DNR order (yellow form or pink MOST form)    No    Current Medications (verified) Outpatient Encounter Medications as of 06/24/2023  Medication Sig   atorvastatin (LIPITOR) 20 MG tablet Take 20 mg  by mouth at bedtime.   No facility-administered encounter medications on file as of 06/24/2023.    Allergies (verified) Patient has no known allergies.   History: History reviewed. No pertinent past medical history. Past Surgical History:  Procedure Laterality Date   APPENDECTOMY     COLONOSCOPY  10/29/2011   Procedure: COLONOSCOPY;  Surgeon: Dalia Heading, MD;  [facility];  Service: Gastroenterology;  Laterality: N/A;   colonscopy     INGUINAL HERNIA REPAIR Left 04/26/2022   Procedure: HERNIA REPAIR INGUINAL ADULT WITH MESH;  Surgeon: Franky Macho, MD;  Location: [facility];  Service: General;  Laterality: Left;   History reviewed. No pertinent family history. Social History   Socioeconomic History   Marital status: Divorced    Spouse name: Not on file   Number of children: Not on file   Years of education: Not on file   Highest education level: Not on file  Occupational History   Not on file  Tobacco Use   Smoking status: Never   Smokeless tobacco: Not on file  Substance and Sexual Activity   Alcohol use: No   Drug use: No   Sexual activity: Not on file  Other Topics Concern   Not on file  Social History Narrative   Not on file   Social Determinants of Health   Financial Resource Strain: Low Risk  (06/24/2023)   Overall Financial Resource Strain (CARDIA)    Difficulty of Paying  Living Expenses: Not hard at all  Food Insecurity: No Food Insecurity (06/24/2023)   Hunger Vital Sign    Worried About Running Out of Food in the Last Year: Never true    Ran Out of Food in the Last Year: Never true  Transportation Needs: No Transportation Needs (06/24/2023)   PRAPARE - Administrator, Civil Service (Medical): No    Lack of Transportation (Non-Medical): No  Physical Activity: Insufficiently Active (06/24/2023)   Exercise Vital Sign    Days of Exercise per Week: 3 days    Minutes of Exercise per Session: 30 min  Stress: No Stress Concern Present  (06/24/2023)   [employer] of Occupational Health - Occupational Stress Questionnaire    Feeling of Stress : Not at all  Social Connections: Moderately Isolated (06/24/2023)   Social Connection and Isolation Panel [NHANES]    Frequency of Communication with Friends and Family: More than three times a week    Frequency of Social Gatherings with Friends and Family: More than three times a week    Attends Religious Services: More than 4 times per year    Active Member of [employer] or Organizations: No    Attends Engineer, structural: Never    Marital Status: Divorced    Tobacco Counseling Counseling given: Not Answered   Clinical Intake:  Pre-visit preparation completed: Yes  Pain : No/denies pain     Nutritional Risks: None Diabetes: No  How often do you need to have someone help you when you read instructions, pamphlets, or other written materials from your doctor or pharmacy?: 1 - Never  Interpreter Needed?: No  Information entered by :: Renie Ora, LPN   Activities of Daily Living    06/24/2023    8:37 AM  In your present state of health, do you have any difficulty performing the following activities:  Hearing? 0  Vision? 0  Difficulty concentrating or making decisions? 0  Walking or climbing stairs? 0  Dressing or bathing? 0  Doing errands, shopping? 0  Preparing Food and eating ? N  Using the Toilet? N  In the past six months, have you accidently leaked urine? N  Do you have problems with loss of bowel control? N  Managing your Medications? N  Managing your Finances? N  Housekeeping or managing your Housekeeping? N    Patient Care Team: Billie Lade, MD as PCP - General (Internal Medicine)  Indicate any recent Medical Services you may have received from other than [facility] providers in the past year (date may be approximate).     Assessment:   This is a routine wellness examination for [place].  Hearing/Vision screen Vision Screening -  Comments:: Referral 06/24/2023   Goals Addressed             This Visit's Progress    DIET - EAT MORE FRUITS AND VEGETABLES         Depression Screen    06/24/2023    8:35 AM 04/04/2023   11:08 AM  PHQ 2/9 Scores  PHQ - 2 Score 0 0  PHQ- 9 Score  0    Fall Risk    06/24/2023    8:34 AM 04/04/2023   11:08 AM  Fall Risk   Falls in the past year? 0 0  Number falls in past yr: 0 0  Injury with Fall? 0 0  Risk for fall due to : No Fall Risks No Fall Risks  Follow up Falls prevention discussed  Falls evaluation completed    MEDICARE RISK AT HOME: Medicare Risk at Home Any stairs in or around the home?: Yes If so, are there any without handrails?: No Home free of loose throw rugs in walkways, pet beds, electrical cords, etc?: Yes Adequate lighting in your home to reduce risk of falls?: Yes Life alert?: No Use of a cane, [place] or w/c?: No Grab bars in the bathroom?: Yes Shower chair or bench in shower?: Yes Elevated toilet seat or a handicapped toilet?: Yes  TIMED UP AND GO:  Was the test performed? No    Cognitive Function:        06/24/2023    8:37 AM  6CIT Screen  What Year? 0 points  What month? 0 points  What time? 0 points  Count back from 20 0 points  Months in reverse 0 points  Repeat phrase 0 points  Total Score 0 points    Immunizations Immunization History  Administered Date(s) Administered   Zoster Recombinant(Shingrix) 05/21/2017    TDAP status: Due, Education has been provided regarding the importance of this vaccine. Advised may receive this vaccine at local pharmacy or Health Dept. Aware to provide a copy of the vaccination record if obtained from local pharmacy or Health Dept. Verbalized acceptance and understanding.  Flu Vaccine status: Due, Education has been provided regarding the importance of this vaccine. Advised may receive this vaccine at local pharmacy or Health Dept. Aware to provide a copy of the vaccination record if obtained  from local pharmacy or Health Dept. Verbalized acceptance and understanding.  Pneumococcal vaccine status: Due, Education has been provided regarding the importance of this vaccine. Advised may receive this vaccine at local pharmacy or Health Dept. Aware to provide a copy of the vaccination record if obtained from local pharmacy or Health Dept. Verbalized acceptance and understanding.  Covid-19 vaccine status: Declined, Education has been provided regarding the importance of this vaccine but patient still declined. Advised may receive this vaccine at local pharmacy or Health Dept.or vaccine clinic. Aware to provide a copy of the vaccination record if obtained from local pharmacy or Health Dept. Verbalized acceptance and understanding.  Qualifies for Shingles Vaccine? Yes   Zostavax completed No   Shingrix Completed?: No.    Education has been provided regarding the importance of this vaccine. Patient has been advised to call insurance company to determine out of pocket expense if they have not yet received this vaccine. Advised may also receive vaccine at local pharmacy or Health Dept. Verbalized acceptance and understanding.  Screening Tests Health Maintenance  Topic Date Due   Hepatitis C Screening  Never done   DTaP/Tdap/Td (1 - Tdap) Never done   Pneumonia Vaccine 11+ Years old (1 of 1 - PCV) Never done   Zoster Vaccines- Shingrix (2 of 2) 07/16/2017   INFLUENZA VACCINE  Never done   COVID-19 Vaccine (1 - 2023-24 season) Never done   Medicare Annual Wellness (AWV)  06/23/2024   Colonoscopy  06/07/2033   HPV VACCINES  Aged Out    Health Maintenance  Health Maintenance Due  Topic Date Due   Hepatitis C Screening  Never done   DTaP/Tdap/Td (1 - Tdap) Never done   Pneumonia Vaccine 43+ Years old (1 of 1 - PCV) Never done   Zoster Vaccines- Shingrix (2 of 2) 07/16/2017   INFLUENZA VACCINE  Never done   COVID-19 Vaccine (1 - 2023-24 season) Never done    Colorectal cancer screening:  Type of screening: Cologuard. Completed  06/08/2023. Repeat every 3 years  Lung Cancer Screening: (Low Dose CT Chest recommended if Age 18-80 years, 20 pack-year currently smoking OR have quit w/in 15years.) does not qualify.   Lung Cancer Screening Referral: n/a  Additional Screening:  Hepatitis C Screening: does qualify;   Vision Screening: Recommended annual ophthalmology exams for early detection of glaucoma and other disorders of the eye. Is the patient up to date with their annual eye exam?  No  Who is the provider or what is the name of the office in which the patient attends annual eye exams? None referral 06/24/2023 If pt is not established with a provider, would they like to be referred to a provider to establish care? No .   Dental Screening: Recommended annual dental exams for proper oral hygiene   Community Resource Referral / Chronic Care Management: CRR required this visit?  No   CCM required this visit?  No    Plan:     I have personally reviewed and noted the following in the patient's chart:   Medical and social history Use of alcohol, tobacco or illicit drugs  Current medications and supplements including opioid prescriptions. Patient is not currently taking opioid prescriptions. Functional ability and status Nutritional status Physical activity Advanced directives List of other physicians Hospitalizations, surgeries, and ER visits in previous 12 months Vitals Screenings to include cognitive, depression, and falls Referrals and appointments  In addition, I have reviewed and discussed with patient certain preventive protocols, quality metrics, and best practice recommendations. A written personalized care plan for preventive services as well as general preventive health recommendations were provided to patient.     Lorrene Reid, LPN   16/05/9603   After Visit Summary: (MyChart) Due to this being a telephonic visit, the after visit summary with  patients personalized plan was offered to patient via MyChart   Nurse Notes: Due TDAp/Pneumonia Vaccine

## 2023-06-24 NOTE — Patient Instructions (Signed)
Mr. Hauber , Thank you for taking time to come for your Medicare Wellness Visit. I appreciate your ongoing commitment to your health goals. Please review the following plan we discussed and let me know if I can assist you in the future.   Referrals/Orders/Follow-Ups/Clinician Recommendations: Aim for 30 minutes of exercise or brisk walking, 6-8 glasses of water, and 5 servings of fruits and vegetables each day.   This is a list of the screening recommended for you and due dates:  Health Maintenance  Topic Date Due   Hepatitis C Screening  Never done   DTaP/Tdap/Td vaccine (1 - Tdap) Never done   Pneumonia Vaccine (1 of 1 - PCV) Never done   Zoster (Shingles) Vaccine (2 of 2) 07/16/2017   Flu Shot  Never done   COVID-19 Vaccine (1 - 2023-24 season) Never done   Medicare Annual Wellness Visit  06/23/2024   Colon Cancer Screening  06/07/2033   HPV Vaccine  Aged Out    Advanced directives: (Copy Requested) Please bring a copy of your health care power of attorney and living will to the office to be added to your chart at your convenience.  Next Medicare Annual Wellness Visit scheduled for next year: Yes  insert Preventive Care attachment Insert FALL PREVENTION attachment if needed

## 2023-07-22 ENCOUNTER — Other Ambulatory Visit: Payer: Self-pay

## 2023-07-22 ENCOUNTER — Telehealth: Payer: Self-pay

## 2023-07-22 DIAGNOSIS — E785 Hyperlipidemia, unspecified: Secondary | ICD-10-CM

## 2023-07-22 MED ORDER — ATORVASTATIN CALCIUM 20 MG PO TABS: 20.0000 mg | ORAL_TABLET | Freq: Every day | ORAL | 0 refills | Status: DC

## 2023-07-22 NOTE — Telephone Encounter (Signed)
Refill sent.

## 2023-07-22 NOTE — Telephone Encounter (Signed)
Copied from CRM [id number]. Topic: Clinical - Medication Refill >> Jul 22, 2023  9:10 AM Louie Boston wrote: Most Recent Primary Care Visit:  Provider: Lorrene Reid  Department: [facility]  Visit Type: MEDICARE AWV, INITIAL  Date: 06/24/2023  Medication: atorvastatin (LIPITOR) 20 MG tablet   Has the patient contacted their pharmacy? No (Agent: If no, request that the patient contact the pharmacy for the refill. If patient does not wish to contact the pharmacy document the reason why and proceed with request.) (Agent: If yes, when and what did the pharmacy advise?)  Patient reached out to request refill. Patient did not contact pharmacy because prescription pill bottle shows no refills left. Patient is out of medication.   Is this the correct pharmacy for this prescription? Yes If no, delete pharmacy and type the correct one.  This is the patient's preferred pharmacy:  [facility] [place], [place] - [address] [place] [address] Phone: [phone] Fax: [phone]   Has the prescription been filled recently? No  Is the patient out of the medication? No  Has the patient been seen for an appointment in the last year OR does the patient have an upcoming appointment? Yes  Can we respond through MyChart? No  Agent: Please be advised that Rx refills may take up to 3 business days. We ask that you follow-up with your pharmacy.

## 2023-11-10 ENCOUNTER — Other Ambulatory Visit: Payer: Self-pay

## 2023-11-10 ENCOUNTER — Telehealth: Payer: Self-pay | Admitting: Internal Medicine

## 2023-11-10 DIAGNOSIS — E785 Hyperlipidemia, unspecified: Secondary | ICD-10-CM

## 2023-11-10 MED ORDER — ATORVASTATIN CALCIUM 20 MG PO TABS: 20.0000 mg | ORAL_TABLET | Freq: Every day | ORAL | 0 refills | Status: DC

## 2023-11-10 NOTE — Telephone Encounter (Signed)
 Refills sent to pharmacy.

## 2023-11-10 NOTE — Telephone Encounter (Signed)
 Patient came by need med refill, he will call back later to reschedule with a nurse practitioner.    Atorvastain 20 mg  Pharmacy: [facility]

## 2024-04-05 ENCOUNTER — Encounter: Payer: Medicare Other | Admitting: Internal Medicine

## 2024-04-09 ENCOUNTER — Encounter: Admitting: Family Medicine

## 2024-04-16 ENCOUNTER — Encounter: Payer: Self-pay | Admitting: Family Medicine

## 2024-04-16 ENCOUNTER — Ambulatory Visit (INDEPENDENT_AMBULATORY_CARE_PROVIDER_SITE_OTHER): Admitting: Family Medicine

## 2024-04-16 VITALS — BP 125/77 | HR 60 | Resp 16 | Ht 71.0 in | Wt 152.0 lb

## 2024-04-16 DIAGNOSIS — R7301 Impaired fasting glucose: Secondary | ICD-10-CM | POA: Diagnosis not present

## 2024-04-16 DIAGNOSIS — Z1159 Encounter for screening for other viral diseases: Secondary | ICD-10-CM

## 2024-04-16 DIAGNOSIS — E559 Vitamin D deficiency, unspecified: Secondary | ICD-10-CM | POA: Diagnosis not present

## 2024-04-16 DIAGNOSIS — E782 Mixed hyperlipidemia: Secondary | ICD-10-CM | POA: Diagnosis not present

## 2024-04-16 DIAGNOSIS — E038 Other specified hypothyroidism: Secondary | ICD-10-CM

## 2024-04-16 DIAGNOSIS — N400 Enlarged prostate without lower urinary tract symptoms: Secondary | ICD-10-CM

## 2024-04-16 DIAGNOSIS — R972 Elevated prostate specific antigen [PSA]: Secondary | ICD-10-CM

## 2024-04-16 NOTE — Assessment & Plan Note (Signed)
 Patient reports taking Atorvastatin  20 mg once daily Repeat panel ordered today Discussed to Limit: Saturated fats: Butter, cream, fatty meats. Trans fats: Fried foods, processed snacks. Sugar and refined carbs: Sweets, white bread. Focus on whole foods, healthy fats, and fiber to improve heart health! Maintain an exercise routine 3 to 5 days a week for a minimum total of 150 minutes.

## 2024-04-16 NOTE — Patient Instructions (Signed)

## 2024-04-16 NOTE — Progress Notes (Signed)
 Established Patient Office Visit   Subjective  Patient ID: Patrick Burgess, male    DOB: May 21, 1951  Age: 73 y.o. MRN: [id number]  Chief Complaint  Patient presents with   Hyperlipidemia    Follow up visit     He  has a past medical history of Hyperlipidemia.  HPI Patient presents to the clinic for chronic follow up. For the details of today's visit, please refer to assessment and plan.   Review of Systems  Constitutional:  Negative for chills and fever.  Eyes:  Negative for blurred vision.  Respiratory:  Negative for shortness of breath.   Cardiovascular:  Negative for chest pain.  Gastrointestinal:  Negative for abdominal pain.  Genitourinary:  Negative for dysuria.  Musculoskeletal:  Negative for joint pain.  Neurological:  Negative for dizziness and headaches.      Objective:     BP 125/77   Pulse 60   Resp 16   Ht 5' 11 (1.803 m)   Wt 152 lb (68.9 kg)   SpO2 97%   BMI 21.20 kg/m  BP Readings from Last 3 Encounters:  04/16/24 125/77  04/04/23 128/81  04/26/22 126/83      Physical Exam Vitals reviewed.  Constitutional:      General: He is not in acute distress.    Appearance: Normal appearance. He is not ill-appearing, toxic-appearing or diaphoretic.  HENT:     Head: Normocephalic.     Left Ear: Tympanic membrane normal.     Mouth/Throat:     Mouth: Mucous membranes are moist.  Eyes:     General:        Right eye: No discharge.        Left eye: No discharge.     Conjunctiva/sclera: Conjunctivae normal.     Pupils: Pupils are equal, round, and reactive to light.  Cardiovascular:     Rate and Rhythm: Normal rate.     Pulses: Normal pulses.     Heart sounds: Normal heart sounds.  Pulmonary:     Effort: Pulmonary effort is normal. No respiratory distress.     Breath sounds: Normal breath sounds.  Abdominal:     General: Bowel sounds are normal.     Palpations: Abdomen is soft.     Tenderness: There is no abdominal tenderness. There is no  right CVA tenderness, left CVA tenderness or guarding.  Musculoskeletal:        General: Normal range of motion.     Cervical back: Normal range of motion.  Skin:    General: Skin is warm and dry.     Capillary Refill: Capillary refill takes less than 2 seconds.  Neurological:     General: No focal deficit present.     Mental Status: He is alert and oriented to person, place, and time.     Coordination: Coordination normal.     Gait: Gait normal.  Psychiatric:        Mood and Affect: Mood normal.        Behavior: Behavior normal.      No results found for any visits on 04/16/24.  The ASCVD Risk score (Arnett DK, et al., 2019) failed to calculate for the following reasons:   Cannot find a previous HDL lab   Cannot find a previous total cholesterol lab    Assessment & Plan:  Mixed hyperlipidemia Assessment & Plan: Patient reports taking Atorvastatin  20 mg once daily Repeat panel ordered today Discussed to Limit: Saturated fats: Butter, cream, fatty  meats. Trans fats: Fried foods, processed snacks. Sugar and refined carbs: Sweets, white bread. Focus on whole foods, healthy fats, and fiber to improve heart health! Maintain an exercise routine 3 to 5 days a week for a minimum total of 150 minutes.   Orders: -     Lipid panel -     CMP14+EGFR -     CBC with Differential/Platelet  IFG (impaired fasting glucose) -     Hemoglobin A1c  Vitamin D deficiency -     VITAMIN D 25 Hydroxy (Vit-D Deficiency, Fractures)  Need for hepatitis C screening test -     Hepatitis C antibody  TSH (thyroid-stimulating hormone deficiency) -     TSH + free T4  BPH with elevated PSA -     PSA    Return in about 6 months (around 10/17/2024), or if symptoms worsen or fail to improve, for Follow up.   Hilario Kidd Wilhelmena Falter, FNP

## 2024-04-17 LAB — LIPID PANEL

## 2024-04-18 LAB — HEMOGLOBIN A1C
Est. average glucose Bld gHb Est-mCnc: 111 mg/dL
Hgb A1c MFr Bld: 5.5 % (ref 4.8–5.6)

## 2024-04-18 LAB — CBC WITH DIFFERENTIAL/PLATELET
Basophils Absolute: 0.1 x10E3/uL (ref 0.0–0.2)
Basos: 1 %
EOS (ABSOLUTE): 0.2 x10E3/uL (ref 0.0–0.4)
Eos: 2 %
Hematocrit: 50.2 % (ref 37.5–51.0)
Hemoglobin: 16.6 g/dL (ref 13.0–17.7)
Immature Grans (Abs): 0 x10E3/uL (ref 0.0–0.1)
Immature Granulocytes: 0 %
Lymphocytes Absolute: 1.4 x10E3/uL (ref 0.7–3.1)
Lymphs: 16 %
MCH: 31.9 pg (ref 26.6–33.0)
MCHC: 33.1 g/dL (ref 31.5–35.7)
MCV: 96 fL (ref 79–97)
Monocytes Absolute: 0.8 x10E3/uL (ref 0.1–0.9)
Monocytes: 9 %
Neutrophils Absolute: 6.4 x10E3/uL (ref 1.4–7.0)
Neutrophils: 72 %
Platelets: 210 x10E3/uL (ref 150–450)
RBC: 5.21 x10E6/uL (ref 4.14–5.80)
RDW: 12.5 % (ref 11.6–15.4)
WBC: 8.9 x10E3/uL (ref 3.4–10.8)

## 2024-04-18 LAB — CMP14+EGFR
ALT: 29 IU/L (ref 0–44)
AST: 31 IU/L (ref 0–40)
Albumin: 4.4 g/dL (ref 3.8–4.8)
Alkaline Phosphatase: 120 IU/L (ref 44–121)
BUN/Creatinine Ratio: 13 (ref 10–24)
BUN: 13 mg/dL (ref 8–27)
Bilirubin Total: 3.7 mg/dL — AB (ref 0.0–1.2)
CO2: 20 mmol/L (ref 20–29)
Calcium: 9.4 mg/dL (ref 8.6–10.2)
Chloride: 101 mmol/L (ref 96–106)
Creatinine, Ser: 0.98 mg/dL (ref 0.76–1.27)
Globulin, Total: 1.9 g/dL (ref 1.5–4.5)
Glucose: 95 mg/dL (ref 70–99)
Potassium: 4.5 mmol/L (ref 3.5–5.2)
Sodium: 139 mmol/L (ref 134–144)
Total Protein: 6.3 g/dL (ref 6.0–8.5)
eGFR: 82 mL/min/1.73 (ref >59)

## 2024-04-18 LAB — TSH+FREE T4
Free T4: 1.01 ng/dL (ref 0.82–1.77)
TSH: 1.29 u[IU]/mL (ref 0.450–4.500)

## 2024-04-18 LAB — PSA: Prostate Specific Ag, Serum: 2.9 ng/mL (ref 0.0–4.0)

## 2024-04-18 LAB — LIPID PANEL
Cholesterol, Total: 142 mg/dL (ref 100–199)
HDL: 39 mg/dL — AB (ref >39)
LDL CALC COMMENT:: 3.6 ratio (ref 0.0–5.0)
LDL Chol Calc (NIH): 88 mg/dL (ref 0–99)
Triglycerides: 75 mg/dL (ref 0–149)
VLDL Cholesterol Cal: 15 mg/dL (ref 5–40)

## 2024-04-18 LAB — VITAMIN D 25 HYDROXY (VIT D DEFICIENCY, FRACTURES): Vit D, 25-Hydroxy: 21.7 ng/mL — ABNORMAL LOW (ref 30.0–100.0)

## 2024-04-18 LAB — HEPATITIS C ANTIBODY: Hep C Virus Ab: NONREACTIVE

## 2024-04-19 ENCOUNTER — Ambulatory Visit: Payer: Self-pay | Admitting: Family Medicine

## 2024-04-19 ENCOUNTER — Other Ambulatory Visit: Payer: Self-pay | Admitting: Family Medicine

## 2024-04-19 MED ORDER — ATORVASTATIN CALCIUM 10 MG PO TABS: 10.0000 mg | ORAL_TABLET | Freq: Every day | ORAL | 3 refills | Status: AC

## 2024-04-19 NOTE — Progress Notes (Signed)
 Please inform patient,  Vitamin D  levels low, I advise to taking  over the counter supplements of vitamin D  1000 IU/day to prevent low vitamin D  levels.  Consume Vitamin D -Rich Foods: Fatty Fish: Salmon, mackerel, tuna, and sardines. Egg Yolks: A good source if eaten whole. Fortified Foods: Milk, orange juice, cereals, and plant-based milks (like almond or soy milk). Mushrooms: Especially those exposed to sunlight or UV light. Cod Liver Oil: A concentrated source of vitamin D . Including these foods in your diet can help boost vitamin D  levels   Symptoms of Low Vitamin D :  Fatigue and low energy. Bone pain and muscle weakness. Increased risk of fractures or weak bones. Frequent illness or infections. Mood changes, like depression or anxiety. Hair loss or thinning.    Cholesterol levels normal, Kidney function normal, Thyroid panel normal, No diabetes, PSA negative

## 2024-06-28 ENCOUNTER — Ambulatory Visit: Payer: Medicare Other

## 2024-06-28 VITALS — BP 142/86 | HR 60 | Resp 12 | Ht 71.5 in | Wt 159.0 lb

## 2024-06-28 DIAGNOSIS — Z23 Encounter for immunization: Secondary | ICD-10-CM

## 2024-06-28 DIAGNOSIS — Z0000 Encounter for general adult medical examination without abnormal findings: Secondary | ICD-10-CM

## 2024-06-28 NOTE — Progress Notes (Signed)
 Subjective:   Patrick Burgess is a 73 y.o. male who presents for a Medicare Annual Wellness Visit.  Allergies (verified) Patient has no known allergies.   History: Past Medical History:  Diagnosis Date   Hyperlipidemia    Past Surgical History:  Procedure Laterality Date   APPENDECTOMY     COLONOSCOPY  10/29/2011   Procedure: COLONOSCOPY;  Surgeon: Oneil DELENA Budge, MD;  Location: [facility];  Service: Gastroenterology;  Laterality: N/A;   colonscopy     INGUINAL HERNIA REPAIR Left 04/26/2022   Procedure: HERNIA REPAIR INGUINAL ADULT WITH MESH;  Surgeon: Budge Oneil, MD;  Location: [facility];  Service: General;  Laterality: Left;   History reviewed. No pertinent family history. Social History   Occupational History   Not on file  Tobacco Use   Smoking status: Never   Smokeless tobacco: Not on file  Substance and Sexual Activity   Alcohol use: No   Drug use: No   Sexual activity: Not on file   Tobacco Counseling Counseling given: Yes  SDOH Screenings   Food Insecurity: No Food Insecurity (06/28/2024)  Housing: Low Risk  (06/28/2024)  Transportation Needs: No Transportation Needs (06/28/2024)  Utilities: Not At Risk (06/28/2024)  Alcohol Screen: Low Risk  (06/24/2023)  Depression (PHQ2-9): Low Risk  (06/28/2024)  Financial Resource Strain: Low Risk  (06/24/2023)  Physical Activity: Sufficiently Active (06/28/2024)  Social Connections: Moderately Isolated (06/28/2024)  Stress: No Stress Concern Present (06/28/2024)  Tobacco Use: Unknown (06/28/2024)  Health Literacy: Adequate Health Literacy (06/28/2024)   Depression Screen    06/28/2024    8:19 AM 04/16/2024   10:02 AM 06/24/2023    8:35 AM 04/04/2023   11:08 AM  PHQ 2/9 Scores  PHQ - 2 Score 0 0 0 0  PHQ- 9 Score 0   0     Goals Addressed               This Visit's Progress     Remain active and healthy (pt-stated)         Visit info / Clinical Intake: Medicare Wellness Visit Type:: Subsequent Annual  Wellness Visit Medicare Wellness Visit Mode:: In-person (required for WTM) Interpreter Needed?: No Pre-visit prep was completed: yes AWV questionnaire completed by patient prior to visit?: no Living arrangements:: (!) lives alone Patient's Overall Health Status Rating: excellent Typical amount of pain: none Does pain affect daily life?: no Are you currently prescribed opioids?: no  Dietary Habits and Nutritional Risks How many meals a day?: 3 Eats fruit and vegetables daily?: yes Most meals are obtained by: preparing own meals In the last 2 weeks, have you had any of the following?: -- (none) Diabetic:: no  Functional Status Activities of Daily Living (to include ambulation/medication): Independent Ambulation: Independent Medication Administration: Independent Home Management: Independent Manage your own finances?: yes Primary transportation is: driving Concerns about vision?: no *vision screening is required for WTM* Concerns about hearing?: no  Fall Screening Falls in the past year?: 0 Number of falls in past year: 0 Was there an injury with Fall?: 0 Fall Risk Category Calculator: 0 Patient Fall Risk Level: Low Fall Risk  Fall Risk Patient at Risk for Falls Due to: No Fall Risks Fall risk Follow up: Falls evaluation completed; Education provided; Falls prevention discussed  Home and Transportation Safety: All rugs have non-skid backing?: yes All stairs or steps have railings?: yes Grab bars in the bathtub or shower?: (!) no Have non-skid surface in bathtub or shower?: yes Good  home lighting?: yes Regular seat belt use?: yes Hospital stays in the last year:: no  Cognitive Assessment Difficulty concentrating, remembering, or making decisions? : no Will 6CIT or Mini Cog be Completed: no 6CIT or Mini Cog Declined: patient alert, oriented, able to answer questions appropriately and recall recent events  Advance Directives (For Healthcare) Does Patient Have a Medical  Advance Directive?: Yes Type of Advance Directive: Healthcare Power of [place]; Living will; Out of facility DNR (pink MOST or yellow form) Copy of Healthcare Power of Attorney in Chart?: No - copy requested Copy of Living Will in Chart?: No - copy requested Out of facility DNR (pink MOST or yellow form) in Chart? (Ambulatory ONLY): No - copy requested Pre-existing out of facility DNR order (yellow form or pink MOST form): -- (patient will bring copy to be scanned into chart)  Reviewed/Updated  Reviewed/Updated: All; Medical History; Surgical History; Family History; Medications; Allergies; Care Teams; Patient Goals        Objective:    Today's Vitals   06/28/24 0810  BP: (!) 142/86  Pulse: 60  Resp: 12  SpO2: 98%  Weight: 159 lb 0.6 oz (72.1 kg)  Height: 5' 11.5 (1.816 m)  PainSc: 0-No pain   Body mass index is 21.87 kg/m.  Current Medications (verified) Outpatient Encounter Medications as of 06/28/2024  Medication Sig   atorvastatin  (LIPITOR) 10 MG tablet Take 1 tablet (10 mg total) by mouth daily.   No facility-administered encounter medications on file as of 06/28/2024.   Hearing/Vision screen Hearing Screening - Comments:: Patient denies any hearing difficulties.   Vision Screening - Comments:: Patient is up to date on eye exams. Inocente Pao w/ [facility] Immunizations and Health Maintenance Health Maintenance  Topic Date Due   DTaP/Tdap/Td (1 - Tdap) Never done   Pneumococcal Vaccine: 50+ Years (1 of 1 - PCV) Never done   COVID-19 Vaccine (1 - 2025-26 season) Never done   Medicare Annual Wellness (AWV)  06/28/2025   Fecal DNA (Cologuard)  06/07/2026   Influenza Vaccine  Completed   Hepatitis C Screening  Completed   Zoster Vaccines- Shingrix  Completed   Meningococcal B Vaccine  Aged Out   Colonoscopy  Discontinued        Assessment/Plan:  This is a routine wellness examination for [name].  Patient Care Team: Del Wilhelmena Falter, Hilario, FNP as PCP -  General (Family Medicine) Leila Inocente, OD [facility])  I have personally reviewed and noted the following in the patient's chart:   Medical and social history Use of alcohol, tobacco or illicit drugs  Current medications and supplements including opioid prescriptions. Functional ability and status Nutritional status Physical activity Advanced directives List of other physicians Hospitalizations, surgeries, and ER visits in previous 12 months Vitals Screenings to include cognitive, depression, and falls Referrals and appointments  Orders Placed This Encounter  Procedures   Flu vaccine HIGH DOSE PF(Fluzone Trivalent)   In addition, I have reviewed and discussed with patient certain preventive protocols, quality metrics, and best practice recommendations. A written personalized care plan for preventive services as well as general preventive health recommendations were provided to patient.   Frederic Tones, CMA   06/28/2024   Return On June 29, 2025 at 8:00 am, for Your Medicare Wellness Visit in the office.  After Visit Summary: (In Person-Printed) AVS printed and given to the patient  Nurse Notes: nothing to report

## 2024-06-28 NOTE — Patient Instructions (Signed)
 Patrick Burgess,  Thank you for taking the time for your Medicare Wellness Visit. I appreciate your continued commitment to your health goals. Please review the care plan we discussed, and feel free to reach out if I can assist you further.  Medicare recommends these wellness visits once per year to help you and your care team stay ahead of potential health issues. These visits are designed to focus on prevention, allowing your provider to concentrate on managing your acute and chronic conditions during your regular appointments.  Please note that Annual Wellness Visits do not include a physical exam. Some assessments may be limited, especially if the visit was conducted virtually. If needed, we may recommend a separate in-person follow-up with your provider.  Wishing you excellent health and many blessings in the year to come!  -Tarrance Januszewski, CMA  Ongoing Care Seeing your primary care provider every 3 to 6 months helps us  monitor your health and provide consistent, personalized care.   Recommended Screenings:  You received your flu vaccine today. Soreness, redness, and swelling where the shot is given, fever, muscle aches, and headache can happen after influenza vaccination.   If you have any questions or concerns, please reach out to our office.   Health Maintenance  Topic Date Due   DTaP/Tdap/Td vaccine (1 - Tdap) Never done   Pneumococcal Vaccine for age over 65 (1 of 1 - PCV) Never done   Flu Shot  Never done   COVID-19 Vaccine (1 - 2025-26 season) Never done   Medicare Annual Wellness Visit  06/28/2025   Cologuard (Stool DNA test)  06/07/2026   Hepatitis C Screening  Completed   Zoster (Shingles) Vaccine  Completed   Meningitis B Vaccine  Aged Out   Colon Cancer Screening  Discontinued       06/28/2024    8:13 AM  Advanced Directives  Does Patient Have a Medical Advance Directive? Yes  Type of Estate Agent of [place];Living will;Out of facility DNR (pink MOST or  yellow form)  Copy of Healthcare Power of Attorney in Chart? No - copy requested  Pre-existing out of facility DNR order (yellow form or pink MOST form) --   Advance Care Planning is important because it: Ensures you receive medical care that aligns with your values, goals, and preferences. Provides guidance to your family and loved ones, reducing the emotional burden of decision-making during critical moments.  Vision: Annual vision screenings are recommended for early detection of glaucoma, cataracts, and diabetic retinopathy. These exams can also reveal signs of chronic conditions such as diabetes and high blood pressure.  Dental: Annual dental screenings help detect early signs of oral cancer, gum disease, and other conditions linked to overall health, including heart disease and diabetes.  Please see the attached documents for additional preventive care recommendations.

## 2024-10-15 ENCOUNTER — Ambulatory Visit: Admitting: Family Medicine

## 2025-06-29 ENCOUNTER — Ambulatory Visit
